# Patient Record
Sex: Female | Born: 2000 | Race: Black or African American | Hispanic: No | Marital: Single | Smoking: Never smoker
Health system: Southern US, Community
[De-identification: ages and names within clinical notes are randomized; demographics above are authoritative.]

## PROBLEM LIST (undated history)

## (undated) ENCOUNTER — Ambulatory Visit: Admission: EM | Payer: BC Managed Care – PPO | Source: Home / Self Care

## (undated) DIAGNOSIS — T7840XA Allergy, unspecified, initial encounter: Secondary | ICD-10-CM

## (undated) HISTORY — DX: Allergy, unspecified, initial encounter: T78.40XA

---
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
## 2018-08-14 ENCOUNTER — Ambulatory Visit: Payer: BC Managed Care – PPO | Admitting: Family Medicine

## 2018-08-14 ENCOUNTER — Encounter: Payer: Self-pay | Admitting: Family Medicine

## 2018-08-14 ENCOUNTER — Other Ambulatory Visit: Payer: Self-pay

## 2018-08-14 VITALS — BP 122/76 | HR 98 | Temp 98.0°F | Resp 16 | Ht 66.06 in | Wt 127.2 lb

## 2018-08-14 DIAGNOSIS — M41126 Adolescent idiopathic scoliosis, lumbar region: Secondary | ICD-10-CM | POA: Diagnosis not present

## 2018-08-14 MED ORDER — IBUPROFEN 400 MG PO TABS: 400.0000 mg | ORAL_TABLET | Freq: Four times a day (QID) | ORAL | 0 refills | Status: AC | PRN
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
## 2018-08-14 NOTE — Patient Instructions (Addendum)
  [employer] $189  Any of the orthotic stores can fit you for inserts Such as [employer] [facility]    If you have lab work done today you will be contacted with your lab results within the next 2 weeks.  If you have not heard from [place] then please contact us. The fastest way to get your results is to register for My Chart.   IF you received an x-ray today, you will receive an invoice from [facility]. Please contact [facility] at [phone] with questions or concerns regarding your invoice.   IF you received labwork today, you will receive an invoice from [facility]. Please contact [facility] at [phone] with questions or concerns regarding your invoice.   Our billing staff will not be able to assist you with questions regarding bills from these companies.  You will be contacted with the lab results as soon as they are available. The fastest way to get your results is to activate your My Chart account. Instructions are located on the last page of this paperwork. If you have not heard from [place] regarding the results in 2 weeks, please contact this office.     Scoliosis Scoliosis is the name given to a spine that curves sideways.Scoliosis can cause twisting of your shoulders, hips, chest, back, and rib cage. What are the causes? The cause of scoliosis is not always known. It may be caused by a birth defect or by a disease that can cause muscular dysfunction and imbalance, such as cerebral palsy and muscular dystrophy. What increases the risk? Having a disease that causes muscle disease or dysfunction. What are the signs or symptoms? Scoliosis often has no signs or symptoms.If they are present, they may include:  Unequal size of one body side compared to the other (asymmetry).  Visible curvature of the spine.  Pain. The pain may limit physical activity.  Shortness of breath.  Bowel or bladder issues.  How is this diagnosed? A skilled health care provider will perform  an evaluation. This will involve:  Taking your history.  Performing a physical examination.  Performing a neurological exam to detect nerve or muscle function loss.  Range of motion studies on the spine.  X-rays.  An MRI may also be obtained. How is this treated? Treatment varies depending on the nature, extent, and severity of the disease. If the curvature is not great, you may need only observation. A brace may be used to prevent scoliosis from progressing. A brace may also be needed during growth spurts. Physical therapy may be of benefit. Surgery may be required. Follow these instructions at home:  Your health care provider may suggest exercises to strengthen your muscles. Perform them as directed.  Ask your health care provider before participating in any sports.  If you have been prescribed an orthopedic brace, wear it as instructed by your health care provider. Contact a health care provider if: Your brace causes the skin to become sore (chafe) or is uncomfortable. Get help right away if:  You have back pain that is not relieved by the medicines prescribed by your health care provider.  Your legs feel weak or you lose function in your legs.  You lose some bowel or bladder control. This information is not intended to replace advice given to you by your health care provider. Make sure you discuss any questions you have with your health care provider. Document Released: 09/24/2000 Document Revised: 03/04/2016 Document Reviewed: 04/01/2016 Elsevier Interactive Patient Education  [employer].
